# Patient Record
Sex: Male | Born: 2005 | Race: Black or African American | Hispanic: No | Marital: Single | Smoking: Never smoker
Health system: Southern US, Community
[De-identification: ages and names within clinical notes are randomized; demographics above are authoritative.]

---
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
## 2024-10-24 DIAGNOSIS — Z6835 Body mass index (BMI) 35.0-35.9, adult: Secondary | ICD-10-CM | POA: Diagnosis not present

## 2024-10-24 DIAGNOSIS — Z0000 Encounter for general adult medical examination without abnormal findings: Secondary | ICD-10-CM | POA: Diagnosis not present

## 2024-10-24 DIAGNOSIS — Z113 Encounter for screening for infections with a predominantly sexual mode of transmission: Secondary | ICD-10-CM | POA: Diagnosis not present

## 2024-10-24 DIAGNOSIS — F909 Attention-deficit hyperactivity disorder, unspecified type: Secondary | ICD-10-CM | POA: Diagnosis not present

## 2024-10-24 DIAGNOSIS — Z13220 Encounter for screening for lipoid disorders: Secondary | ICD-10-CM | POA: Diagnosis not present

## 2024-10-24 DIAGNOSIS — Z23 Encounter for immunization: Secondary | ICD-10-CM | POA: Diagnosis not present

## 2024-10-24 DIAGNOSIS — Z131 Encounter for screening for diabetes mellitus: Secondary | ICD-10-CM | POA: Diagnosis not present

## 2024-10-24 DIAGNOSIS — Z1329 Encounter for screening for other suspected endocrine disorder: Secondary | ICD-10-CM | POA: Diagnosis not present
